# Patient Record
Sex: Male | Born: 1985 | Race: Black or African American | Hispanic: No | Marital: Married | Smoking: Never smoker
Health system: Southern US, Community
[De-identification: ages and names within clinical notes are randomized; demographics above are authoritative.]

## PROBLEM LIST (undated history)

## (undated) DIAGNOSIS — L309 Dermatitis, unspecified: Secondary | ICD-10-CM

## (undated) HISTORY — DX: Dermatitis, unspecified: L30.9

---

## 2013-12-30 ENCOUNTER — Ambulatory Visit: Payer: Self-pay | Admitting: Physician Assistant

## 2014-01-06 ENCOUNTER — Encounter: Payer: Self-pay | Admitting: Physician Assistant

## 2014-01-06 ENCOUNTER — Ambulatory Visit (INDEPENDENT_AMBULATORY_CARE_PROVIDER_SITE_OTHER): Payer: BC Managed Care – PPO

## 2014-01-06 ENCOUNTER — Ambulatory Visit (INDEPENDENT_AMBULATORY_CARE_PROVIDER_SITE_OTHER): Payer: BC Managed Care – PPO | Admitting: Physician Assistant

## 2014-01-06 VITALS — BP 138/100 | HR 74 | Temp 97.9°F | Ht 73.0 in | Wt 339.8 lb

## 2014-01-06 DIAGNOSIS — Z0000 Encounter for general adult medical examination without abnormal findings: Secondary | ICD-10-CM

## 2014-01-06 DIAGNOSIS — L259 Unspecified contact dermatitis, unspecified cause: Secondary | ICD-10-CM

## 2014-01-06 DIAGNOSIS — L309 Dermatitis, unspecified: Secondary | ICD-10-CM

## 2014-01-06 DIAGNOSIS — R030 Elevated blood-pressure reading, without diagnosis of hypertension: Secondary | ICD-10-CM

## 2014-01-06 DIAGNOSIS — IMO0001 Reserved for inherently not codable concepts without codable children: Secondary | ICD-10-CM

## 2014-01-06 LAB — CBC WITH DIFFERENTIAL/PLATELET
BASOS PCT: 0.4 % (ref 0.0–3.0)
Basophils Absolute: 0 10*3/uL (ref 0.0–0.1)
EOS PCT: 4 % (ref 0.0–5.0)
Eosinophils Absolute: 0.3 10*3/uL (ref 0.0–0.7)
HEMATOCRIT: 48.7 % (ref 39.0–52.0)
HEMOGLOBIN: 16 g/dL (ref 13.0–17.0)
LYMPHS ABS: 2.7 10*3/uL (ref 0.7–4.0)
Lymphocytes Relative: 30.9 % (ref 12.0–46.0)
MCHC: 32.9 g/dL (ref 30.0–36.0)
MCV: 92.9 fl (ref 78.0–100.0)
Monocytes Absolute: 0.8 10*3/uL (ref 0.1–1.0)
Monocytes Relative: 9.3 % (ref 3.0–12.0)
NEUTROS ABS: 4.8 10*3/uL (ref 1.4–7.7)
Neutrophils Relative %: 55.4 % (ref 43.0–77.0)
Platelets: 225 10*3/uL (ref 150.0–400.0)
RBC: 5.24 Mil/uL (ref 4.22–5.81)
RDW: 13.5 % (ref 11.5–14.6)
WBC: 8.7 10*3/uL (ref 4.5–10.5)

## 2014-01-06 LAB — URINALYSIS, ROUTINE W REFLEX MICROSCOPIC
Bilirubin Urine: NEGATIVE
Hgb urine dipstick: NEGATIVE
Ketones, ur: NEGATIVE
LEUKOCYTES UA: NEGATIVE
NITRITE: NEGATIVE
PH: 8.5 — AB (ref 5.0–8.0)
RBC / HPF: NONE SEEN (ref 0–?)
SPECIFIC GRAVITY, URINE: 1.015 (ref 1.000–1.030)
UROBILINOGEN UA: 0.2 (ref 0.0–1.0)
Urine Glucose: NEGATIVE
WBC, UA: NONE SEEN (ref 0–?)

## 2014-01-06 LAB — BASIC METABOLIC PANEL
BUN: 9 mg/dL (ref 6–23)
CO2: 26 mEq/L (ref 19–32)
Calcium: 9.5 mg/dL (ref 8.4–10.5)
Chloride: 103 mEq/L (ref 96–112)
Creatinine, Ser: 1.2 mg/dL (ref 0.4–1.5)
GFR: 94.17 mL/min (ref >60.00)
GLUCOSE: 102 mg/dL — AB (ref 70–99)
POTASSIUM: 4.4 meq/L (ref 3.5–5.1)
Sodium: 138 mEq/L (ref 135–145)

## 2014-01-06 LAB — HEPATIC FUNCTION PANEL
ALT: 93 U/L — AB (ref 0–53)
AST: 46 U/L — ABNORMAL HIGH (ref 0–37)
Albumin: 4.5 g/dL (ref 3.5–5.2)
Alkaline Phosphatase: 44 U/L (ref 39–117)
BILIRUBIN TOTAL: 0.9 mg/dL (ref 0.3–1.2)
Bilirubin, Direct: 0.1 mg/dL (ref 0.0–0.3)
TOTAL PROTEIN: 7.8 g/dL (ref 6.0–8.3)

## 2014-01-06 LAB — LIPID PANEL
Cholesterol: 221 mg/dL — ABNORMAL HIGH (ref 0–200)
HDL: 38.2 mg/dL — ABNORMAL LOW (ref >39.00)
Total CHOL/HDL Ratio: 6
Triglycerides: 126 mg/dL (ref 0.0–149.0)
VLDL: 25.2 mg/dL (ref 0.0–40.0)

## 2014-01-06 LAB — TSH: TSH: 3.16 u[IU]/mL (ref 0.35–5.50)

## 2014-01-06 MED ORDER — LISINOPRIL-HYDROCHLOROTHIAZIDE 10-12.5 MG PO TABS: 1.0000 | ORAL_TABLET | Freq: Every day | ORAL | Status: DC

## 2014-01-06 MED ORDER — TRIAMCINOLONE ACETONIDE 0.1 % EX CREA: 1.0000 "application " | TOPICAL_CREAM | Freq: Two times a day (BID) | CUTANEOUS | Status: DC

## 2014-01-06 NOTE — Progress Notes (Signed)
   Patient ID: Ross Strickland is a 28 y.o. male DOB: 1986/05/23 MRN: [id number]     HPI: patient is a 28 year old male who presents to the office today to establish care and have cpe. Patient reports a chronic history with eczema well controlled with Triamcinolone cream. Reports has always been athletic, played football in high school and college. States has always had a high blood pressure and was told in past nothing to be concerned with.  Denies other history of chronic medical conditions or medications. Denies chest pain/palpitations, cough, SOB, extremity swelling, visual change/disturbance, frequent HA, N/V/F/C, pain/difficulty swallowing, numbness, weakness, change in bowel/bladder habits.    Influenza: declines Tetanus: Tdap 2013 Dentist: twice yearly   ROS: As stated in HPI. All other systems negative  Past Medical History  Diagnosis Date  . Eczema    Family History  Problem Relation Age of Onset  . Hyperlipidemia Father   . Eczema Sister   . Eczema Brother   . Dementia Paternal Grandmother     early dementia  . Obesity Paternal Grandfather   . Hypertension Paternal Grandfather   . Eczema Brother   . Eczema Brother   . Eczema Brother    History   Social History  . Marital Status: Married    Spouse Name: N/A    Number of Children: N/A  . Years of Education: N/A   Social History Main Topics  . Smoking status: Never Smoker   . Smokeless tobacco: None  . Alcohol Use: None  . Drug Use: None  . Sexual Activity: None   Other Topics Concern  . None   Social History Narrative  . None   History reviewed. No pertinent past surgical history. No current outpatient prescriptions on file prior to visit.   No current facility-administered medications on file prior to visit.   No Known Allergies  PE: CONSTITUTIONAL: Well developed, well nourished, pleasant, appears stated age, in NAD HEENT: normocephalic, atraumatic, bilateral ext/int canals normal. Bilateral TM's  without injections, bulging, erythema. Nose normal, uvula midline, oropharynx clear and moist. EYES: PERRLA, bilateral EOM and conjunctiva normal NECK: FROM, supple, without thyromegaly or mass CARDIO: RRR, normal S1 and S2, distal pulses intact. No LE edema bilateral. PULM/CHEST CTA bilateral, no wheezes, rales or rhonchi. Non tender. ABD: appearance normal, soft, nontender. Normal bowel sounds x 4 quadrants, no palpable liver, kidney, spleen. GU: deferred.  MUSC: FROM U/LE bilateral LYMPH: no cervical, supraclavicular adenopathy NEURO: alert and oriented x 3, no cranial nerve deficit, motor strength and coordination NL. Negative romberg. Gait normal. SKIN: warm, dry, no rash or lesions noted. PSYCH: Mood and affect normal, speech normal.  Filed Vitals:   01/06/14 1015  BP: 138/100  Pulse: 74  Temp: 97.9 F (36.6 C)   Blood pressure repeated in left arm, my read 130/88. BP in right arm 118/82.   ASSESSMENT and PLAN   CPX/v70.0 - Patient has been counseled on age-appropriate routine health concerns for screening and prevention. These are reviewed and up-to-date. Immunizations are up-to-date or declined. Labs ordered and will be reviewed.   Eczema  Well controlled with Triamcinolone cream, refilled today  Elevated blood pressure: Repeat readings today, no consistent numbers. Concerns for HTN. Started patient on HCTZ 12.5 mg today, patient instructed to take readings in each arm daily and record. RTO in two weeks for reevaluation. Counseled patient may have to consider referral to nephrology for further evaluation.

## 2014-01-06 NOTE — Patient Instructions (Addendum)
It was great meeting you today Ross Strickland!   Labs have been ordered for you, when you report to lab please be fasting.    I have ordered HCTZ for blood pressure control.  Please monitor your blood pressure daily in each arm and document your results. Return to the office for re-evaluation in two weeks please.     Eczema Eczema, also called atopic dermatitis, is a skin disorder that causes inflammation of the skin. It causes a red rash and dry, scaly skin. The skin becomes very itchy. Eczema is generally worse during the cooler winter months and often improves with the warmth of summer. Eczema usually starts showing signs in infancy. Some children outgrow eczema, but it may last through adulthood.  CAUSES  The exact cause of eczema is not known, but it appears to run in families. People with eczema often have a family history of eczema, allergies, asthma, or hay fever. Eczema is not contagious. Flare-ups of the condition may be caused by:   Contact with something you are sensitive or allergic to.   Stress. SIGNS AND SYMPTOMS  Dry, scaly skin.   Red, itchy rash.   Itchiness. This may occur before the skin rash and may be very intense.  DIAGNOSIS  The diagnosis of eczema is usually made based on symptoms and medical history. TREATMENT  Eczema cannot be cured, but symptoms usually can be controlled with treatment and other strategies. A treatment plan might include:  Controlling the itching and scratching.   Use over-the-counter antihistamines as directed for itching. This is especially useful at night when the itching tends to be worse.   Use over-the-counter steroid creams as directed for itching.   Avoid scratching. Scratching makes the rash and itching worse. It may also result in a skin infection (impetigo) due to a break in the skin caused by scratching.   Keeping the skin well moisturized with creams every day. This will seal in moisture and help prevent dryness.  Lotions that contain alcohol and water should be avoided because they can dry the skin.   Limiting exposure to things that you are sensitive or allergic to (allergens).   Recognizing situations that cause stress.   Developing a plan to manage stress.  HOME CARE INSTRUCTIONS   Only take over-the-counter or prescription medicines as directed by your health care provider.   Do not use anything on the skin without checking with your health care provider.   Keep baths or showers short (5 minutes) in warm (not hot) water. Use mild cleansers for bathing. These should be unscented. You may add nonperfumed bath oil to the bath water. It is best to avoid soap and bubble bath.   Immediately after a bath or shower, when the skin is still damp, apply a moisturizing ointment to the entire body. This ointment should be a petroleum ointment. This will seal in moisture and help prevent dryness. The thicker the ointment, the better. These should be unscented.   Keep fingernails cut short. Children with eczema may need to wear soft gloves or mittens at night after applying an ointment.   Dress in clothes made of cotton or cotton blends. Dress lightly, because heat increases itching.   A child with eczema should stay away from anyone with fever blisters or cold sores. The virus that causes fever blisters (herpes simplex) can cause a serious skin infection in children with eczema. SEEK MEDICAL CARE IF:   Your itching interferes with sleep.   Your rash  gets worse or is not better within 1 week after starting treatment.   You see pus or soft yellow scabs in the rash area.   You have a fever.   You have a rash flare-up after contact with someone who has fever blisters.  Document Released: 11/11/2000 Document Revised: 09/04/2013 Document Reviewed: 06/17/2013 [facility] Patient Information 2014 [place], [place].     Testicular Self-Exam A self-exam of your testicles is looking at and  feeling your testicles for abnormal lumps or swelling. Several things can cause swelling, lumps, or pain in your testicles. Some of these causes are:  Injuries.  Puffiness, redness, and soreness (inflammation).  Infection.  Extra fluids around your testicle.  Twisted testicles.  Testicular cancer. The testicles are easiest to check after warm baths or showers. They are harder to examine when you are cold.  Follow these steps while you are standing:  Hold your penis away from your body.  Roll one testicle between your thumb and finger. Feel the entire testicle.  Roll the other testicle between your thumb and finger. Feel the entire testicle. Feel for lumps, swelling, or discomfort. A normal testicle is egg shaped. It feels firm. It is smooth and not tender. The spermatic cord feels like a firm spaghetti-like cord. It is at the back of your testicle. Examine the crease between the front of your leg and your abdomen. Feel for any bumps that are tender. These could be enlarged lymph nodes.  Document Released: 02/10/2009 Document Revised: 09/04/2013 Document Reviewed: 05/06/2013 [facility] Patient Information 2014 [place], [place].     Health Maintenance, Males A healthy lifestyle and preventative care can promote health and wellness.  Maintain regular health, dental, and eye exams.  Eat a healthy diet. Foods like vegetables, fruits, whole grains, low-fat dairy products, and lean protein foods contain the nutrients you need and are low in calories. Decrease your intake of foods high in solid fats, added sugars, and salt. Get information about a proper diet from your health care provider, if necessary.  Regular physical exercise is one of the most important things you can do for your health. Most adults should get at least 150 minutes of moderate-intensity exercise (any activity that increases your heart rate and causes you to sweat) each week. In addition, most adults need  muscle-strengthening exercises on 2 or more days a week.   Maintain a healthy weight. The body mass index (BMI) is a screening tool to identify possible weight problems. It provides an estimate of body fat based on height and weight. Your health care provider can find your BMI and can help you achieve or maintain a healthy weight. For males 20 years and older:  A BMI below 18.5 is considered underweight.  A BMI of 18.5 to 24.9 is normal.  A BMI of 25 to 29.9 is considered overweight.  A BMI of 30 and above is considered obese.  Maintain normal blood lipids and cholesterol by exercising and minimizing your intake of saturated fat. Eat a balanced diet with plenty of fruits and vegetables. Blood tests for lipids and cholesterol should begin at age 28 and be repeated every 5 years. If your lipid or cholesterol levels are high, you are over 50, or you are at high risk for heart disease, you may need your cholesterol levels checked more frequently.Ongoing high lipid and cholesterol levels should be treated with medicines, if diet and exercise are not working.  If you smoke, find out from your health care provider how to quit.  If you do not use tobacco, do not start.  Lung cancer screening is recommended for adults aged 63 80 years who are at high risk for developing lung cancer because of a history of smoking. A yearly low-dose CT scan of the lungs is recommended for people who have at least a 30-pack-year history of smoking and are a current smoker or have quit within the past 15 years. A pack year of smoking is smoking an average of 1 pack of cigarettes a day for 1 year (for example, a 30-pack-year history of smoking could mean smoking 1 pack a day for 30 years or 2 packs a day for 15 years). Yearly screening should continue until the smoker has stopped smoking for at least 15 years. Yearly screening should be stopped for people who develop a health problem that would prevent them from having lung  cancer treatment.  If you choose to drink alcohol, do not have more than 2 drinks per day. One drink is considered to be 12 oz (360 mL) of beer, 5 oz (150 mL) of wine, or 1.5 oz (45 mL) of liquor.  Avoid use of street drugs. Do not share needles with anyone. Ask for help if you need support or instructions about stopping the use of drugs.  High blood pressure causes heart disease and increases the risk of stroke. Blood pressure should be checked at least every 1 2 years. Ongoing high blood pressure should be treated with medicines if weight loss and exercise are not effective.  If you are 47 28 years old, ask your health care provider if you should take aspirin to prevent heart disease.  Diabetes screening involves taking a blood sample to check your fasting blood sugar level. This should be done once every 3 years after age 31, if you are at a normal weight and without risk factors for diabetes. Testing should be considered at a younger age or be carried out more frequently if you are overweight and have at least 1 risk factor for diabetes.  Colorectal cancer can be detected and often prevented. Most routine colorectal cancer screening begins at the age of 43 and continues through age 34. However, your health care provider may recommend screening at an earlier age if you have risk factors for colon cancer. On a yearly basis, your health care provider may provide home test kits to check for hidden blood in the stool. A small camera at the end of a tube may be used to directly examine the colon (sigmoidoscopy or colonoscopy) to detect the earliest forms of colorectal cancer. Talk to your health care provider about this at age 31, when routine screening begins. A direct exam of the colon should be repeated every 5 10 years through age 18, unless early forms of pre-cancerous polyps or small growths are found.  People who are at an increased risk for hepatitis B should be screened for this virus. You are  considered at high risk for hepatitis B if:  You were born in a country where hepatitis B occurs often. Talk with your health care provider about which countries are considered high-risk.  Your parents were born in a high-risk country and you have not received a shot to protect against hepatitis B (hepatitis B vaccine).  You have HIV or AIDS.  You use needles to inject street drugs.  You live with, or have sex with, someone who has hepatitis B.  You are a man who has sex with other men (MSM).  You get hemodialysis treatment.  You take certain medicines for conditions like cancer, organ transplantation, and autoimmune conditions.  Hepatitis C blood testing is recommended for all people born from [birth year] through 1965 and any individual with known risk factors for hepatitis C.  Healthy men should no longer receive prostate-specific antigen (PSA) blood tests as part of routine cancer screening. Talk to your health care provider about prostate cancer screening.  Testicular cancer screening is not recommended for adolescents or adult males who have no symptoms. Screening includes self-exam, a health care provider exam, and other screening tests. Consult with your health care provider about any symptoms you have or any concerns you have about testicular cancer.  Practice safe sex. Use condoms and avoid high-risk sexual practices to reduce the spread of sexually transmitted infections (STIs).  Use sunscreen. Apply sunscreen liberally and repeatedly throughout the day. You should seek shade when your shadow is shorter than you. Protect yourself by wearing long sleeves, pants, a wide-brimmed hat, and sunglasses year round, whenever you are outdoors.  Tell your health care provider of new moles or changes in moles, especially if there is a change in shape or color. Also tell your provider if a mole is larger than the size of a pencil eraser.  A one-time screening for abdominal aortic aneurysm (AAA)  and surgical repair of large AAAs by ultrasound is recommended for men aged 65 75 years who are current or former smokers.  Stay current with your vaccines (immunizations). Document Released: 05/12/2008 Document Revised: 09/04/2013 Document Reviewed: 04/11/2011 [facility] Patient Information 2014 [place], [place].

## 2014-01-06 NOTE — Progress Notes (Signed)
Pre visit review using our clinic review tool, if applicable. No additional management support is needed unless otherwise documented below in the visit note. 

## 2014-01-07 ENCOUNTER — Other Ambulatory Visit: Payer: Self-pay | Admitting: Physician Assistant

## 2014-01-07 DIAGNOSIS — E785 Hyperlipidemia, unspecified: Secondary | ICD-10-CM

## 2014-01-07 LAB — LDL CHOLESTEROL, DIRECT: Direct LDL: 166.4 mg/dL

## 2014-01-07 MED ORDER — ATORVASTATIN CALCIUM 20 MG PO TABS: 20.0000 mg | ORAL_TABLET | Freq: Every day | ORAL | Status: DC

## 2014-01-20 ENCOUNTER — Ambulatory Visit (INDEPENDENT_AMBULATORY_CARE_PROVIDER_SITE_OTHER): Payer: BC Managed Care – PPO | Admitting: Physician Assistant

## 2014-01-20 ENCOUNTER — Encounter: Payer: Self-pay | Admitting: Physician Assistant

## 2014-01-20 VITALS — BP 120/84 | HR 67 | Temp 97.4°F | Ht 73.0 in | Wt 339.8 lb

## 2014-01-20 DIAGNOSIS — E7800 Pure hypercholesterolemia, unspecified: Secondary | ICD-10-CM

## 2014-01-20 DIAGNOSIS — I10 Essential (primary) hypertension: Secondary | ICD-10-CM | POA: Insufficient documentation

## 2014-01-20 NOTE — Progress Notes (Signed)
Pre-visit discussion using our clinic review tool. No additional management support is needed unless otherwise documented below in the visit note.  

## 2014-01-20 NOTE — Assessment & Plan Note (Signed)
Continue on current medications Lipitor 20 mg, one tab daily

## 2014-01-20 NOTE — Progress Notes (Signed)
   Subjective:    Patient ID: Evelena PeatDeshawn Sakai, male    DOB: 08/26/1986, 28 y.o.   MRN: [id number]  HPI Comments: Patient is a 28 year old male who presents to the office for a follow up of starting blood pressure medication. Patient recently started on Prinzide for his blood pressure and lipitor for elevated cholesterol. Reports no side effects form the medication. Log at home for the last two weeks shows bp readings ranging from 120-130/70-80 since starting the medication. Denies N/V, lightheaded, cough, myalgias, dizzy, eye pain or visual change/disturbance, epistaxis, chest pain or palpitations.    Past Medical History  Diagnosis Date  . Eczema     Review of Systems  Constitutional: Negative for fatigue.  HENT: Negative for nosebleeds.   Eyes: Negative for pain and visual disturbance.  Respiratory: Negative for chest tightness.   Cardiovascular: Negative for chest pain and palpitations.  Neurological: Negative for dizziness, weakness, light-headedness, numbness and headaches.       Objective:   Physical Exam  Vitals reviewed. Constitutional: He is oriented to person, place, and time. He appears well-developed and well-nourished.  HENT:  Head: Normocephalic and atraumatic.  Eyes: Conjunctivae are normal.  Neck: Normal range of motion.  Cardiovascular: Normal rate, regular rhythm and intact distal pulses.  Exam reveals no gallop and no friction rub.   No murmur heard. Pulmonary/Chest: Effort normal and breath sounds normal.  Neurological: He is alert and oriented to person, place, and time.  Skin: Skin is warm and dry.  Psychiatric: He has a normal mood and affect.   Filed Vitals:   01/20/14 0808  BP: 120/84  Pulse: 67  Temp: 97.4 F (36.3 C)   BP Readings from Last 3 Encounters:  01/20/14 120/84  01/06/14 138/100       Assessment & Plan:  HTN: Continue on current medications.  Lisinopril-HCTZ 10-12.5 RTO in 6 months for  evaluation  Hypercholesterolemia Continue on current medications Lipitor 20 mg, one tab daily

## 2014-01-20 NOTE — Assessment & Plan Note (Signed)
Continue on current medications.  Lisinopril-HCTZ 10-12.5 RTO in 6 months for evaluation

## 2014-01-20 NOTE — Patient Instructions (Signed)
Nice to see you again Ross Strickland!  Keep up the good work!  Your have three refills remaining on your blood pressure medication.  DASH Diet The DASH diet stands for "Dietary Approaches to Stop Hypertension." It is a healthy eating plan that has been shown to reduce high blood pressure (hypertension) in as little as 14 days, while also possibly providing other significant health benefits. These other health benefits include reducing the risk of breast cancer after menopause and reducing the risk of type 2 diabetes, heart disease, colon cancer, and stroke. Health benefits also include weight loss and slowing kidney failure in patients with chronic kidney disease.  DIET GUIDELINES  Limit salt (sodium). Your diet should contain less than 1500 mg of sodium daily.  Limit refined or processed carbohydrates. Your diet should include mostly whole grains. Desserts and added sugars should be used sparingly.  Include small amounts of heart-healthy fats. These types of fats include nuts, oils, and tub margarine. Limit saturated and trans fats. These fats have been shown to be harmful in the body. CHOOSING FOODS  The following food groups are based on a 2000 calorie diet. See your Registered Dietitian for individual calorie needs. Grains and Grain Products (6 to 8 servings daily)  Eat More Often: Whole-wheat bread, brown rice, whole-grain or wheat pasta, quinoa, popcorn without added fat or salt (air popped).  Eat Less Often: White bread, white pasta, white rice, cornbread. Vegetables (4 to 5 servings daily)  Eat More Often: Fresh, frozen, and canned vegetables. Vegetables may be raw, steamed, roasted, or grilled with a minimal amount of fat.  Eat Less Often/Avoid: Creamed or fried vegetables. Vegetables in a cheese sauce. Fruit (4 to 5 servings daily)  Eat More Often: All fresh, canned (in natural juice), or frozen fruits. Dried fruits without added sugar. One hundred percent fruit juice ( cup [237  mL] daily).  Eat Less Often: Dried fruits with added sugar. Canned fruit in light or heavy syrup. Foot LockerLean Meats, Fish, and Poultry (2 servings or less daily. One serving is 3 to 4 oz [85-114 g]).  Eat More Often: Ninety percent or leaner ground beef, tenderloin, sirloin. Round cuts of beef, chicken breast, Mal[place]urkey breast. All fish. Grill, bake, or broil your meat. Nothing should be fried.  Eat Less Often/Avoid: Fatty cuts of meat, [place]urkey, or chicken leg, thigh, or wing. Fried cuts of meat or fish. Dairy (2 to 3 servings)  Eat More Often: Low-fat or fat-free milk, low-fat plain or light yogurt, reduced-fat or part-skim cheese.  Eat Less Often/Avoid: Milk (whole, 2%).Whole milk yogurt. Full-fat cheeses. Nuts, Seeds, and Legumes (4 to 5 servings per week)  Eat More Often: All without added salt.  Eat Less Often/Avoid: Salted nuts and seeds, canned beans with added salt. Fats and Sweets (limited)  Eat More Often: Vegetable oils, tub margarines without trans fats, sugar-free gelatin. Mayonnaise and salad dressings.  Eat Less Often/Avoid: Coconut oils, palm oils, butter, stick margarine, cream, half and half, cookies, candy, pie. FOR MORE INFORMATION The Dash Diet Eating Plan: www.dashdiet.org Document Released: 11/03/2011 Document Revised: 02/06/2012 Document Reviewed: 11/03/2011 [facility] Patient Information 2014 [place], [place].

## 2014-02-26 ENCOUNTER — Emergency Department (HOSPITAL_COMMUNITY): Payer: BC Managed Care – PPO

## 2014-02-26 ENCOUNTER — Encounter (HOSPITAL_COMMUNITY): Payer: Self-pay | Admitting: Emergency Medicine

## 2014-02-26 ENCOUNTER — Emergency Department (HOSPITAL_COMMUNITY)
Admission: EM | Admit: 2014-02-26 | Discharge: 2014-02-26 | Disposition: A | Discharge status: Home / Self Care | Payer: BC Managed Care – PPO | Attending: Emergency Medicine | Admitting: Emergency Medicine

## 2014-02-26 DIAGNOSIS — X500XXA Overexertion from strenuous movement or load, initial encounter: Secondary | ICD-10-CM | POA: Insufficient documentation

## 2014-02-26 DIAGNOSIS — Z79899 Other long term (current) drug therapy: Secondary | ICD-10-CM | POA: Insufficient documentation

## 2014-02-26 DIAGNOSIS — S82009A Unspecified fracture of unspecified patella, initial encounter for closed fracture: Secondary | ICD-10-CM | POA: Insufficient documentation

## 2014-02-26 DIAGNOSIS — Z872 Personal history of diseases of the skin and subcutaneous tissue: Secondary | ICD-10-CM | POA: Insufficient documentation

## 2014-02-26 DIAGNOSIS — Y9239 Other specified sports and athletic area as the place of occurrence of the external cause: Secondary | ICD-10-CM | POA: Insufficient documentation

## 2014-02-26 DIAGNOSIS — Y92838 Other recreation area as the place of occurrence of the external cause: Secondary | ICD-10-CM

## 2014-02-26 DIAGNOSIS — Y9367 Activity, basketball: Secondary | ICD-10-CM | POA: Insufficient documentation

## 2014-02-26 DIAGNOSIS — IMO0002 Reserved for concepts with insufficient information to code with codable children: Secondary | ICD-10-CM | POA: Insufficient documentation

## 2014-02-26 DIAGNOSIS — S86819A Strain of other muscle(s) and tendon(s) at lower leg level, unspecified leg, initial encounter: Secondary | ICD-10-CM

## 2014-02-26 MED ORDER — HYDROMORPHONE HCL PF 1 MG/ML IJ SOLN: 1.0000 mg | Freq: Once | INTRAMUSCULAR | Status: DC

## 2014-02-26 MED ORDER — OXYCODONE-ACETAMINOPHEN 5-325 MG PO TABS: 1.0000 | ORAL_TABLET | ORAL | Status: DC | PRN

## 2014-02-26 MED ORDER — ONDANSETRON HCL 4 MG/2ML IJ SOLN: 4.0000 mg | Freq: Once | INTRAMUSCULAR | Status: DC

## 2014-02-26 NOTE — ED Notes (Signed)
Pt st's he does not need pain meds at this time.

## 2014-02-26 NOTE — Progress Notes (Signed)
Orthopedic Tech Progress Note Patient Details:  Ross PeatDeshawn Nissan 04/02/1986 [id number]  Ortho Devices Type of Ortho Device: Crutches;Knee Immobilizer Ortho Device/Splint Location: lle Ortho Device/Splint Interventions: Application   Lon Klippel 02/26/2014, 9:44 PM

## 2014-02-26 NOTE — ED Notes (Signed)
Pt advised not to eat or drink anything at this time.  Pt voices understanding.

## 2014-02-26 NOTE — ED Notes (Signed)
Ortho tech paged for knee immobilizer and crutches 

## 2014-02-26 NOTE — ED Provider Notes (Signed)
CSN: [id number]     Arrival date & time 02/26/14  1946 History   First MD Initiated Contact with Patient 02/26/14 1956     Chief Complaint  Patient presents with  . Knee Injury   HPI A 28 year old male presents complaining of knee injury. He is playing basketball. He jumped and came down the leg. He had immediate, severe pain. He felt and heard a pop and his leg gave out. He was unable to stand afterwards even with assistance. His pain is severe, relieved by fentanyl given by EMS. Aggravated by movement. He has no numbness or tingling. He has normal range of motion in his ankle. He's never injured this knee before. He has a history of hyperlipidemia and hypertension. Otherwise no chronic medical problems.   Past Medical History  Diagnosis Date  . Eczema    History reviewed. No pertinent past surgical history. Family History  Problem Relation Age of Onset  . Hyperlipidemia Father   . Eczema Sister   . Eczema Brother   . Dementia Paternal Grandmother     early dementia  . Obesity Paternal Grandfather   . Hypertension Paternal Grandfather   . Eczema Brother   . Eczema Brother   . Eczema Brother    History  Substance Use Topics  . Smoking status: Never Smoker   . Smokeless tobacco: Not on file  . Alcohol Use: Yes     Comment: occ    Review of Systems  Constitutional: Negative for fever and chills.  HENT: Negative for congestion and rhinorrhea.   Eyes: Negative for visual disturbance.  Respiratory: Negative for cough and shortness of breath.   Cardiovascular: Negative for chest pain and leg swelling.  Gastrointestinal: Negative for nausea, vomiting, abdominal pain and diarrhea.  Genitourinary: Negative for dysuria, urgency, frequency, flank pain and difficulty urinating.  Musculoskeletal: Negative for back pain, neck pain and neck stiffness.  Skin: Negative for rash.  Neurological: Negative for syncope, weakness, numbness and headaches.  All other systems reviewed and are  negative.      Allergies  Review of patient's allergies indicates no known allergies.  Home Medications   Current Outpatient Rx  Name  Route  Sig  Dispense  Refill  . atorvastatin (LIPITOR) 20 MG tablet   Oral   Take 1 tablet (20 mg total) by mouth daily.   90 tablet   1   . lisinopril-hydrochlorothiazide (PRINZIDE,ZESTORETIC) 10-12.5 MG per tablet   Oral   Take 1 tablet by mouth daily.   30 tablet   3   . triamcinolone cream (KENALOG) 0.1 %   Topical   Apply 1 application topically 2 (two) times daily.   454 g   1   . oxyCODONE-acetaminophen (PERCOCET) 5-325 MG per tablet   Oral   Take 1-2 tablets by mouth every 4 (four) hours as needed.   30 tablet   0    BP 126/61  Pulse 90  Temp(Src) 97.6 F (36.4 C) (Oral)  Resp 18  Ht 6\' 2"  (1.88 m)  Wt 290 lb (131.543 kg)  BMI 37.22 kg/m2  SpO2 97% Physical Exam  Nursing note and vitals reviewed. Constitutional: He is oriented to person, place, and time. He appears well-developed and well-nourished. No distress.  HENT:  Head: Normocephalic and atraumatic.  Mouth/Throat: Oropharynx is clear and moist.  Eyes: Conjunctivae and EOM are normal. Pupils are equal, round, and reactive to light. No scleral icterus.  Neck: Normal range of motion. Neck supple. No JVD present.  Cardiovascular: Normal rate, regular rhythm, normal heart sounds and intact distal pulses.  Exam reveals no gallop and no friction rub.   No murmur heard. Pulmonary/Chest: Effort normal and breath sounds normal. No respiratory distress. He has no wheezes. He has no rales.  Abdominal: Soft. Bowel sounds are normal. He exhibits no distension. There is no tenderness. There is no rebound and no guarding.  Musculoskeletal: He exhibits no edema.  Left knee with mild tenderness to palpation over medial joint line. He has a high riding left patella. He has a palpable patellar tendon defect. His extensor mechanism of his knee is not intact, he is unable to lift  his heel off the bed. He has no tenderness over his proximal tibia, proximal fibula, or at his ankle. He has normal sensation to touch throughout his lower extremity. He has 2+ DP pulse and his Lipitor should be. No other muscular skeletal injury.  Neurological: He is alert and oriented to person, place, and time. No cranial nerve deficit. He exhibits normal muscle tone. Coordination normal.  Skin: Skin is warm and dry. He is not diaphoretic.    ED Course  Procedures (including critical care time) Labs Review Labs Reviewed - No data to display Imaging Review Dg Knee Ap/lat W/sunrise Left  02/26/2014   CLINICAL DATA:  Anterior knee pain.  EXAM: DG KNEE - 3 VIEWS  COMPARISON:  None.  FINDINGS: Two view exam shows a high riding patella. There is a bony fragment separated from the patella which may well represent the inferior patellar pole. Soft tissue swelling is seen in the anterior knee. No substantial joint effusion.  IMPRESSION: Patella Alta with probable avulsion of the inferior patellar pole versus chronic patellar tendon tear. There is some thickening of the patellar tendon in the region of the bony fragment.   Electronically Signed   By: Kennith CenterEric  Mansell M.D.   On: 02/26/2014 20:41     EKG Interpretation None      MDM   28 yo M with knee injury. Was playing basketball, landed on feet, heard pop, knee pain, leg gave out.  Unable to ambulate. On exam, clinically, he has patellar tendon rupture.  Extensor mechanism not intact.  Palpable tendon defect.  High riding patella. XR confirms patella alta and avulsion of inf patellar pole.  Spoke with orthopedics, Dr. Eulah PontMurphy.  He advises knee immobilizer.  He will see patient in clinic on Monday, plan to operate on Thursday.  - RX for  Discharge Medication List as of 02/26/2014  8:57 PM    START taking these medications   Details  oxyCODONE-acetaminophen (PERCOCET) 5-325 MG per tablet Take 1-2 tablets by mouth every 4 (four) hours as needed.,  Starting 02/26/2014, Until Discontinued, Print       Gave crutches. Advised RICE.  Final diagnoses:  Patellar tendon rupture  Patellar fracture      Toney SangJerrid Vetra Shinall, MD 02/27/14 [id number]

## 2014-02-26 NOTE — ED Notes (Signed)
Playing basketball, came down on left leg, heard a pop, leg gave out.  Left knee swelling, bending is very painful.  Able to bear some weight. Ice applied.

## 2014-02-26 NOTE — Discharge Instructions (Signed)
You have ruptured your patellar tendon, which is the tendon that attaches your kneecap to your lower leg. You will need an operation to repair this. Dr. Eulah PontMurphy I plan to see you in his clinic on Monday morning. He will plan to perform the operation on Thursday.  I will provide you with crutches and a knee immobilizer. He should wear the knee immobilizer at all times except when in bed. You can remove it to take a shower.  We will provide you with a prescription for pain medication. The pain medication is for moderate to severe pain for mild pain he may take Tylenol. However your pain medication contains Tylenol, so you should not take Tylenol more than 3 times daily. Your pain medication should not be taken when you will be operating heavy machinery, including driving. You should not drink alcohol when you are taking your pain medication as mixing the 2 can be dangerous.  Please return to the emergency department for severe pain uncontrolled by oral pain medication.

## 2014-02-27 NOTE — ED Provider Notes (Signed)
I saw and evaluated the patient, reviewed the resident's note and I agree with the findings and plan.   EKG Interpretation None      Ross Strickland is a 28 y.o. male here with L knee pain. He was jumping while playing basketball and heard a pop and then had severe L knee pain. Then he may have landed on L knee. On exam, high riding patella, there is obvious depression at the patella tendon. PCL and ACL otherwise intact. The resident called Dr. Eulah PontMurphy who wants knee immobilizer and f/u in clinic to plan for operation.   Richardean Canalavid H Yao, MD 02/27/14 1052

## 2014-03-05 ENCOUNTER — Other Ambulatory Visit: Payer: Self-pay | Admitting: *Deleted

## 2014-03-05 NOTE — Telephone Encounter (Signed)
[facility] is requesting 90 supply.

## 2014-03-06 MED ORDER — LISINOPRIL-HYDROCHLOROTHIAZIDE 10-12.5 MG PO TABS: 1.0000 | ORAL_TABLET | Freq: Every day | ORAL | Status: DC

## 2014-07-21 ENCOUNTER — Ambulatory Visit: Payer: BC Managed Care – PPO | Admitting: Physician Assistant

## 2014-09-08 ENCOUNTER — Ambulatory Visit: Payer: BC Managed Care – PPO | Admitting: Internal Medicine

## 2014-09-17 ENCOUNTER — Encounter: Payer: Self-pay | Admitting: Family

## 2014-09-17 ENCOUNTER — Ambulatory Visit (INDEPENDENT_AMBULATORY_CARE_PROVIDER_SITE_OTHER): Payer: BC Managed Care – PPO | Admitting: Family

## 2014-09-17 VITALS — BP 138/88 | HR 70 | Temp 97.3°F | Resp 18 | Ht 74.0 in | Wt 340.0 lb

## 2014-09-17 DIAGNOSIS — E7800 Pure hypercholesterolemia, unspecified: Secondary | ICD-10-CM

## 2014-09-17 DIAGNOSIS — I10 Essential (primary) hypertension: Secondary | ICD-10-CM | POA: Diagnosis not present

## 2014-09-17 DIAGNOSIS — L309 Dermatitis, unspecified: Secondary | ICD-10-CM

## 2014-09-17 MED ORDER — TRIAMCINOLONE ACETONIDE 0.1 % EX CREA: 1.0000 "application " | TOPICAL_CREAM | Freq: Two times a day (BID) | CUTANEOUS | Status: DC

## 2014-09-17 NOTE — Assessment & Plan Note (Signed)
Last lipid panel in February was high. Pt stopped taking his medication because he felt it was not working. Counseled patient about the risks of high cholesterol and that he will not feel any different even if the medication is effective. Will obtain lipid panel when patient is fasting and discuss options upon results. Adamant about not taking medications if he does not have to.

## 2014-09-17 NOTE — Patient Instructions (Signed)
Thank you for choosing [facility].  Summary/Instructions:   Your prescription has been sent to your pharmacy  Please schedule a time for your physical in February  Please stop by the lab at your convenience to have your lipid levels drawn. Please be FASTING

## 2014-09-17 NOTE — Assessment & Plan Note (Signed)
Not currently taking any medication and blood pressure today was below goal of <140/<90. Continue current course of no medication at this time. Will follow during physical and patient will periodically record blood pressure measurements.

## 2014-09-17 NOTE — Progress Notes (Signed)
Pre visit review using our clinic review tool, if applicable. No additional management support is needed unless otherwise documented below in the visit note. 

## 2014-09-17 NOTE — Progress Notes (Signed)
   Subjective:    Patient ID: Ross PeatDeshawn Strickland, male    DOB: 04/24/1986, 28 y.o.   MRN: [id number]  Chief Complaint  Patient presents with  . Medication Refill    needs kenalog refill, has to wait to see assigned pcp in late november     HPI:  Ross Strickland is a 28 y.o. male who presents today for medication refill.  1) Eczema - Currently using triamcinolone 0.1% for maintenance. Indicates maintained well. Eczema not all over the body. Denies any adverse effects, states the cream work well.  2) Hypertension - Has not taken any blood pressure medication since April. No chest pain/discomfort, shortness of breath, or edema. States that he gave it a run and did not notice any changes.   BP Readings from Last 3 Encounters:  09/17/14 138/88  02/26/14 126/61  01/20/14 120/84    3) Hypercholesterolemia - currently not taking the Lipitor. Didn't feel like it was doing anything.  Lab Results  Component Value Date   CHOL 221* 01/06/2014   HDL 38.20* 01/06/2014   LDLDIRECT 166.4 01/06/2014   TRIG 126.0 01/06/2014   CHOLHDL 6 01/06/2014   No Known Allergies  Current Outpatient Prescriptions on File Prior to Visit  Medication Sig Dispense Refill  . atorvastatin (LIPITOR) 20 MG tablet Take 1 tablet (20 mg total) by mouth daily.  90 tablet  1  . oxyCODONE-acetaminophen (PERCOCET) 5-325 MG per tablet Take 1-2 tablets by mouth every 4 (four) hours as needed.  30 tablet  0   No current facility-administered medications on file prior to visit.   Past Medical History  Diagnosis Date  . Eczema     Review of Systems    See HPI  Objective:    BP 138/88  Pulse 70  Temp(Src) 97.3 F (36.3 C) (Oral)  Resp 18  Ht 6\' 2"  (1.88 m)  Wt 340 lb (154.223 kg)  BMI 43.63 kg/m2  SpO2 95% Nursing note and vital signs reviewed.  Physical Exam  Constitutional: He is oriented to person, place, and time. He appears well-developed and well-nourished. No distress.  Cardiovascular: Normal rate, regular  rhythm and normal heart sounds.   Pulmonary/Chest: Effort normal and breath sounds normal.  Neurological: He is alert and oriented to person, place, and time.  Skin: Skin is warm and dry.  Psychiatric: He has a normal mood and affect. His behavior is normal. Judgment and thought content normal.       Assessment & Plan:

## 2014-09-17 NOTE — Assessment & Plan Note (Signed)
Currently stable with triamcinoline 0.1%. Discussed importance of moisturizers and use of steroid cream. Continue triamcinolone 0.1% as needed for eczema.

## 2014-12-15 ENCOUNTER — Encounter: Payer: Self-pay | Admitting: Family

## 2014-12-16 ENCOUNTER — Telehealth: Payer: Self-pay | Admitting: *Deleted

## 2014-12-16 NOTE — Telephone Encounter (Signed)
[facility] - Client TELEPHONE ADVICE RECORD [facility] Patient Name: Evelena PeatDESHAWN Parrilla Gender: Male DOB: 10/23/1986 Age: 29 Y 1 M 16 D Return Phone Number: Address: City/State/Zip: [place] [facility] Client Site [facility] Contact Type Call Caller Name same Caller Phone Number n/a Relationship To Patient Self Is this call to report lab results? No Call Type General Information Initial Comment caller states he is running late for his 8am appt General Information Type Appointment Nurse Assessment Guidelines Guideline Title Affirmed Question Affirmed Notes Nurse Date/Time (Eastern Time) Disp. Time Lamount Cohen(Eastern Time) Disposition Final User 12/15/2014 7:57:32 AM General Information Provided Yes Alfonse AlpersMcAtee, Shannon After Care Instructions Given Call Event Type User Date / Time Description

## 2014-12-17 ENCOUNTER — Encounter: Payer: Self-pay | Admitting: Family

## 2014-12-26 ENCOUNTER — Other Ambulatory Visit: Payer: Self-pay

## 2014-12-30 ENCOUNTER — Other Ambulatory Visit (INDEPENDENT_AMBULATORY_CARE_PROVIDER_SITE_OTHER): Payer: Managed Care, Other (non HMO)

## 2014-12-30 ENCOUNTER — Ambulatory Visit (INDEPENDENT_AMBULATORY_CARE_PROVIDER_SITE_OTHER): Payer: Managed Care, Other (non HMO) | Admitting: Family

## 2014-12-30 ENCOUNTER — Encounter: Payer: Self-pay | Admitting: Family

## 2014-12-30 VITALS — BP 124/86 | HR 65 | Temp 98.4°F | Resp 18 | Ht 74.0 in | Wt 338.0 lb

## 2014-12-30 DIAGNOSIS — Z0000 Encounter for general adult medical examination without abnormal findings: Secondary | ICD-10-CM

## 2014-12-30 DIAGNOSIS — L309 Dermatitis, unspecified: Secondary | ICD-10-CM

## 2014-12-30 LAB — BASIC METABOLIC PANEL
BUN: 12 mg/dL (ref 6–23)
CALCIUM: 10.1 mg/dL (ref 8.4–10.5)
CO2: 28 mEq/L (ref 19–32)
Chloride: 103 mEq/L (ref 96–112)
Creatinine, Ser: 1.27 mg/dL (ref 0.40–1.50)
GFR: 86.74 mL/min (ref >60.00)
GLUCOSE: 148 mg/dL — AB (ref 70–99)
Potassium: 4.4 mEq/L (ref 3.5–5.1)
Sodium: 136 mEq/L (ref 135–145)

## 2014-12-30 LAB — CBC
HEMATOCRIT: 48.1 % (ref 39.0–52.0)
HEMOGLOBIN: 16.2 g/dL (ref 13.0–17.0)
MCHC: 33.7 g/dL (ref 30.0–36.0)
MCV: 89.6 fl (ref 78.0–100.0)
Platelets: 223 10*3/uL (ref 150.0–400.0)
RBC: 5.37 Mil/uL (ref 4.22–5.81)
RDW: 13.3 % (ref 11.5–15.5)
WBC: 9.6 10*3/uL (ref 4.0–10.5)

## 2014-12-30 LAB — LIPID PANEL
CHOL/HDL RATIO: 6
CHOLESTEROL: 229 mg/dL — AB (ref 0–200)
HDL: 36.6 mg/dL — ABNORMAL LOW (ref >39.00)
LDL Cholesterol: 157 mg/dL — ABNORMAL HIGH (ref 0–99)
NonHDL: 192.4
Triglycerides: 179 mg/dL — ABNORMAL HIGH (ref 0.0–149.0)
VLDL: 35.8 mg/dL (ref 0.0–40.0)

## 2014-12-30 LAB — TSH: TSH: 2.7 u[IU]/mL (ref 0.35–4.50)

## 2014-12-30 MED ORDER — TRIAMCINOLONE ACETONIDE 0.1 % EX CREA: 1.0000 "application " | TOPICAL_CREAM | Freq: Two times a day (BID) | CUTANEOUS | Status: DC

## 2014-12-30 NOTE — Progress Notes (Signed)
Pre visit review using our clinic review tool, if applicable. No additional management support is needed unless otherwise documented below in the visit note. 

## 2014-12-30 NOTE — Patient Instructions (Signed)
Thank you for choosing [facility].  Summary/Instructions:  Your prescription(s) have been submitted to your pharmacy or been printed and provided for you. Please take as directed and contact our office if you believe you are having problem(s) with the medication(s) or have any questions.  Please stop by the lab on the basement level of the building for your blood work. Your results will be released to MyChart (or called to you) after review, usually within 72 hours after test completion. If any changes need to be made, you will be notified at that same time.   Health Maintenance A healthy lifestyle and preventative care can promote health and wellness.  Maintain regular health, dental, and eye exams.  Eat a healthy diet. Foods like vegetables, fruits, whole grains, low-fat dairy products, and lean protein foods contain the nutrients you need and are low in calories. Decrease your intake of foods high in solid fats, added sugars, and salt. Get information about a proper diet from your health care provider, if necessary.  Regular physical exercise is one of the most important things you can do for your health. Most adults should get at least 150 minutes of moderate-intensity exercise (any activity that increases your heart rate and causes you to sweat) each week. In addition, most adults need muscle-strengthening exercises on 2 or more days a week.   Maintain a healthy weight. The body mass index (BMI) is a screening tool to identify possible weight problems. It provides an estimate of body fat based on height and weight. Your health care provider can find your BMI and can help you achieve or maintain a healthy weight. For males 20 years and older:  A BMI below 18.5 is considered underweight.  A BMI of 18.5 to 24.9 is normal.  A BMI of 25 to 29.9 is considered overweight.  A BMI of 30 and above is considered obese.  Maintain normal blood lipids and cholesterol by exercising and  minimizing your intake of saturated fat. Eat a balanced diet with plenty of fruits and vegetables. Blood tests for lipids and cholesterol should begin at age 20 and be repeated every 5 years. If your lipid or cholesterol levels are high, you are over age 50, or you are at high risk for heart disease, you may need your cholesterol levels checked more frequently.Ongoing high lipid and cholesterol levels should be treated with medicines if diet and exercise are not working.  If you smoke, find out from your health care provider how to quit. If you do not use tobacco, do not start.  Lung cancer screening is recommended for adults aged 55-80 years who are at high risk for developing lung cancer because of a history of smoking. A yearly low-dose CT scan of the lungs is recommended for people who have at least a 30-pack-year history of smoking and are current smokers or have quit within the past 15 years. A pack year of smoking is smoking an average of 1 pack of cigarettes a day for 1 year (for example, a 30-pack-year history of smoking could mean smoking 1 pack a day for 30 years or 2 packs a day for 15 years). Yearly screening should continue until the smoker has stopped smoking for at least 15 years. Yearly screening should be stopped for people who develop a health problem that would prevent them from having lung cancer treatment.  If you choose to drink alcohol, do not have more than 2 drinks per day. One drink is considered to be   12 oz (360 mL) of beer, 5 oz (150 mL) of wine, or 1.5 oz (45 mL) of liquor.  Avoid the use of street drugs. Do not share needles with anyone. Ask for help if you need support or instructions about stopping the use of drugs.  High blood pressure causes heart disease and increases the risk of stroke. Blood pressure should be checked at least every 1-2 years. Ongoing high blood pressure should be treated with medicines if weight loss and exercise are not effective.  If you are  45-79 years old, ask your health care provider if you should take aspirin to prevent heart disease.  Diabetes screening involves taking a blood sample to check your fasting blood sugar level. This should be done once every 3 years after age 45 if you are at a normal weight and without risk factors for diabetes. Testing should be considered at a younger age or be carried out more frequently if you are overweight and have at least 1 risk factor for diabetes.  Colorectal cancer can be detected and often prevented. Most routine colorectal cancer screening begins at the age of 50 and continues through age 75. However, your health care provider may recommend screening at an earlier age if you have risk factors for colon cancer. On a yearly basis, your health care provider may provide home test kits to check for hidden blood in the stool. A small camera at the end of a tube may be used to directly examine the colon (sigmoidoscopy or colonoscopy) to detect the earliest forms of colorectal cancer. Talk to your health care provider about this at age 50 when routine screening begins. A direct exam of the colon should be repeated every 5-10 years through age 75, unless early forms of precancerous polyps or small growths are found.  People who are at an increased risk for hepatitis B should be screened for this virus. You are considered at high risk for hepatitis B if:  You were born in a country where hepatitis B occurs often. Talk with your health care provider about which countries are considered high risk.  Your parents were born in a high-risk country and you have not received a shot to protect against hepatitis B (hepatitis B vaccine).  You have HIV or AIDS.  You use needles to inject street drugs.  You live with, or have sex with, someone who has hepatitis B.  You are a man who has sex with other men (MSM).  You get hemodialysis treatment.  You take certain medicines for conditions like cancer, organ  transplantation, and autoimmune conditions.  Hepatitis C blood testing is recommended for all people born from 1945 through 1965 and any individual with known risk factors for hepatitis C.  Healthy men should no longer receive prostate-specific antigen (PSA) blood tests as part of routine cancer screening. Talk to your health care provider about prostate cancer screening.  Testicular cancer screening is not recommended for adolescents or adult males who have no symptoms. Screening includes self-exam, a health care provider exam, and other screening tests. Consult with your health care provider about any symptoms you have or any concerns you have about testicular cancer.  Practice safe sex. Use condoms and avoid high-risk sexual practices to reduce the spread of sexually transmitted infections (STIs).  You should be screened for STIs, including gonorrhea and chlamydia if:  You are sexually active and are younger than 24 years.  You are older than 24 years, and your health care provider   tells you that you are at risk for this type of infection.  Your sexual activity has changed since you were last screened, and you are at an increased risk for chlamydia or gonorrhea. Ask your health care provider if you are at risk.  If you are at risk of being infected with HIV, it is recommended that you take a prescription medicine daily to prevent HIV infection. This is called pre-exposure prophylaxis (PrEP). You are considered at risk if:  You are a man who has sex with other men (MSM).  You are a heterosexual man who is sexually active with multiple partners.  You take drugs by injection.  You are sexually active with a partner who has HIV.  Talk with your health care provider about whether you are at high risk of being infected with HIV. If you choose to begin PrEP, you should first be tested for HIV. You should then be tested every 3 months for as long as you are taking PrEP.  Use sunscreen. Apply  sunscreen liberally and repeatedly throughout the day. You should seek shade when your shadow is shorter than you. Protect yourself by wearing long sleeves, pants, a wide-brimmed hat, and sunglasses year round whenever you are outdoors.  Tell your health care provider of new moles or changes in moles, especially if there is a change in shape or color. Also, tell your health care provider if a mole is larger than the size of a pencil eraser.  A one-time screening for abdominal aortic aneurysm (AAA) and surgical repair of large AAAs by ultrasound is recommended for men aged 65-75 years who are current or former smokers.  Stay current with your vaccines (immunizations). Document Released: 05/12/2008 Document Revised: 11/19/2013 Document Reviewed: 04/11/2011 Exit[employer] Patient Information 2015 [employer]. This information is not intended to replace advice given to you by your health care provider. Make sure you discuss any questions you have with your health care provider.   

## 2014-12-30 NOTE — Progress Notes (Signed)
Subjective:    Patient ID: Ross Strickland, male    DOB: 09/07/1986, 29 y.o.   MRN: [id number]   Chief Complaint  Patient presents with  . CPE    Fasting    HPI:  Ross PeatDeshawn Lucci is a 29 y.o. male who presents today for an annual wellness visit.   1) Health Maintenance -  Overall feels good.  Diet -  Eating 3 meals per day; limits processed foods; increasing fruits and vegetables.   Exercise - None at this point.   2) Preventative Exams / Immunizations:  Dental -- Due for exam - is scheduled for exam   Vision --  Up to date   Health Maintenance  Topic Date Due  . INFLUENZA VACCINE  06/28/2014  . TETANUS/TDAP  11/28/2021  Declines flu shot.   Immunization History  Administered Date(s) Administered  . Tdap 11/29/2011    No Known Allergies   No current outpatient prescriptions on file prior to visit.   No current facility-administered medications on file prior to visit.    Past Medical History  Diagnosis Date  . Eczema     History reviewed. No pertinent past surgical history.  Family History  Problem Relation Age of Onset  . Hyperlipidemia Father   . Eczema Sister   . Eczema Brother   . Dementia Paternal Grandmother     early dementia  . Obesity Paternal Grandfather   . Hypertension Paternal Grandfather   . Eczema Brother   . Eczema Brother   . Eczema Brother     History   Social History  . Marital Status: Married    Spouse Name: N/A    Number of Children: 1  . Years of Education: 16   Occupational History  . Consultant    Social History Main Topics  . Smoking status: Never Smoker   . Smokeless tobacco: Never Used  . Alcohol Use: Yes     Comment: occasional  . Drug Use: No  . Sexual Activity: Not on file   Other Topics Concern  . Not on file   Social History Narrative   [place], [place]. Currently working on purchasing a home. Lives with his wife and child. Fun: Sports activities   Denies any religious  beliefs that would effect health care.      Review of Systems  Constitutional: Denies fever, chills, fatigue, or significant weight gain/loss. HENT: Head: Denies headache or neck pain Ears: Denies changes in hearing, ringing in ears, earache, drainage Nose: Denies discharge, stuffiness, itching, nosebleed, sinus pain Throat: Denies sore throat, hoarseness, dry mouth, sores, thrush Eyes: Denies loss/changes in vision, pain, redness, blurry/double vision, flashing lights Cardiovascular: Denies chest pain/discomfort, tightness, palpitations, shortness of breath with activity, difficulty lying down, swelling, sudden awakening with shortness of breath Respiratory: Denies shortness of breath, cough, sputum production, wheezing Gastrointestinal: Denies dysphasia, heartburn, change in appetite, nausea, change in bowel habits, rectal bleeding, constipation, diarrhea, yellow skin or eyes Genitourinary: Denies frequency, urgency, burning/pain, blood in urine, incontinence, change in urinary strength. Musculoskeletal: Denies muscle/joint pain, stiffness, back pain, redness or swelling of joints, trauma Skin: Denies rashes, lumps, itching, dryness, color changes, or hair/nail changes Neurological: Denies dizziness, fainting, seizures, weakness, numbness, tingling, tremor Psychiatric - Denies nervousness, stress, depression or memory loss Endocrine: Denies heat or cold intolerance, sweating, frequent urination, excessive thirst, changes in appetite Hematologic: Denies ease of bruising or bleeding     Objective:    BP 124/86 mmHg  Pulse 65  Temp(Src) 98.4 F (36.9 C) (Oral)  Resp 18  Ht  (1.88 m)  Wt 338 lb (153.316 kg)  BMI 43.38 kg/m2  SpO2 94% Nursing note and vital signs reviewed.  Physical Exam  Constitutional: He is oriented to person, place, and time. He appears well-developed and well-nourished.  HENT:  Head: Normocephalic.  Right Ear: Hearing, tympanic membrane, external ear  and ear canal normal.  Left Ear: Hearing, tympanic membrane, external ear and ear canal normal.  Nose: Nose normal.  Mouth/Throat: Uvula is midline, oropharynx is clear and moist and mucous membranes are normal.  Eyes: Conjunctivae and EOM are normal. Pupils are equal, round, and reactive to light.  Neck: Neck supple. No JVD present. No tracheal deviation present. No thyromegaly present.  Cardiovascular: Normal rate, regular rhythm, normal heart sounds and intact distal pulses.   Pulmonary/Chest: Effort normal and breath sounds normal.  Abdominal: Soft. Bowel sounds are normal. He exhibits no distension and no mass. There is no tenderness. There is no rebound and no guarding.  Musculoskeletal: Normal range of motion. He exhibits no edema or tenderness.  Lymphadenopathy:    He has no cervical adenopathy.  Neurological: He is alert and oriented to person, place, and time. He has normal reflexes. No cranial nerve deficit. He exhibits normal muscle tone. Coordination normal.  Skin: Skin is warm and dry.  Psychiatric: He has a normal mood and affect. His behavior is normal. Judgment and thought content normal.       Assessment & Plan:

## 2014-12-30 NOTE — Assessment & Plan Note (Signed)
Refilled Kenalog

## 2014-12-30 NOTE — Assessment & Plan Note (Addendum)
1) Anticipatory Guidance: Discussed importance of wearing a seatbelt while driving and not texting while driving; changing batteries in smoke detector at least once annually; wearing suntan lotion when outside; eating a balanced and moderate diet; getting physical activity at least 30 minutes per day.  2) Immunizations / Screenings / Labs:  Declined flu shot. All immunizations are up to date. Due for a dental and vision exam. All other screenings are up to date per recommendations. Obtain CBC, BMET, Lipid profile and TSH.   Overall well exam. Discussed risk factor of obesity. Recommended goal of losing 5-10% of body weight through increasing nutrient density and physical activity. Otherwise continue healthy lifestyle behaviors and follow up prevention exam in 1 year.

## 2014-12-31 ENCOUNTER — Other Ambulatory Visit: Payer: Self-pay | Admitting: Family

## 2014-12-31 ENCOUNTER — Encounter: Payer: Self-pay | Admitting: Family

## 2014-12-31 DIAGNOSIS — R7301 Impaired fasting glucose: Secondary | ICD-10-CM

## 2015-06-20 IMAGING — CR DG KNEE AP/LAT W/ SUNRISE*L*
2 series · 2 of 2 positions shown · non-contrast
Comparison: None.

CLINICAL DATA: Anterior knee pain.

EXAM:
DG KNEE - 3 VIEWS

[x knee ap left]
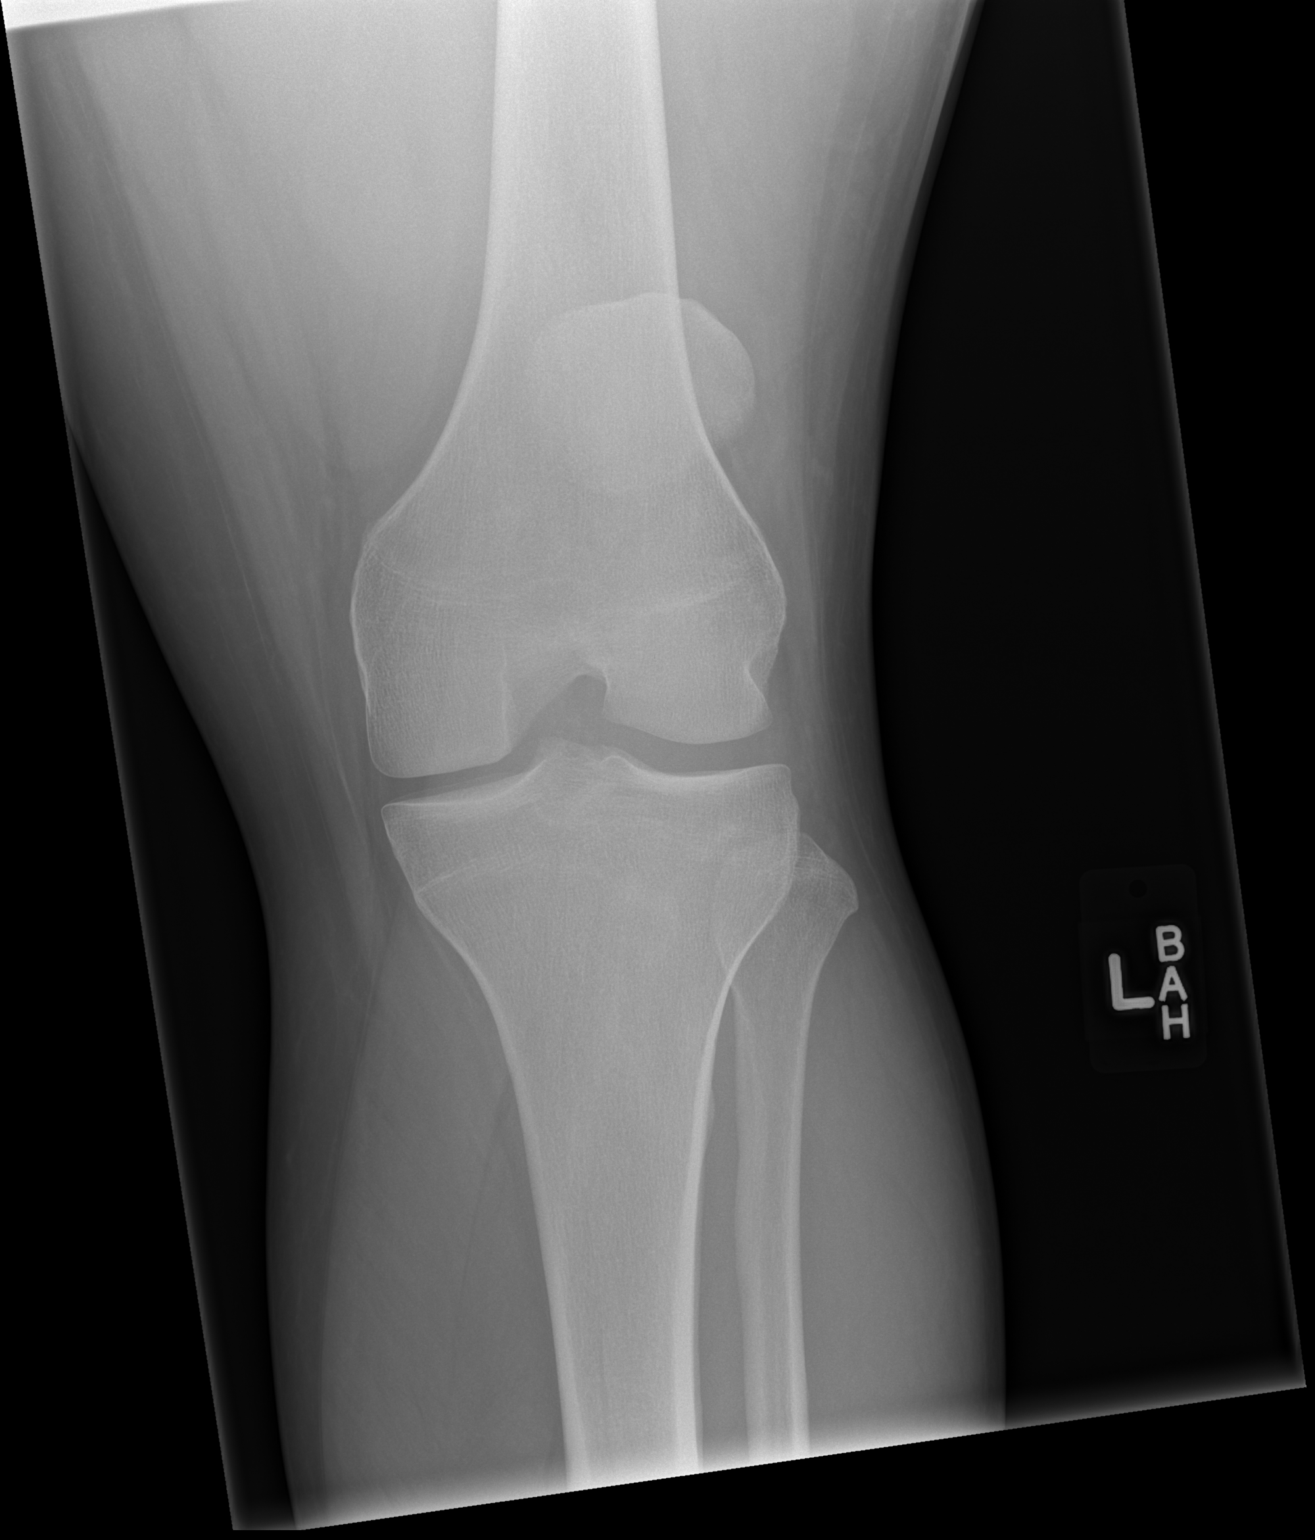

[x knee lat left]
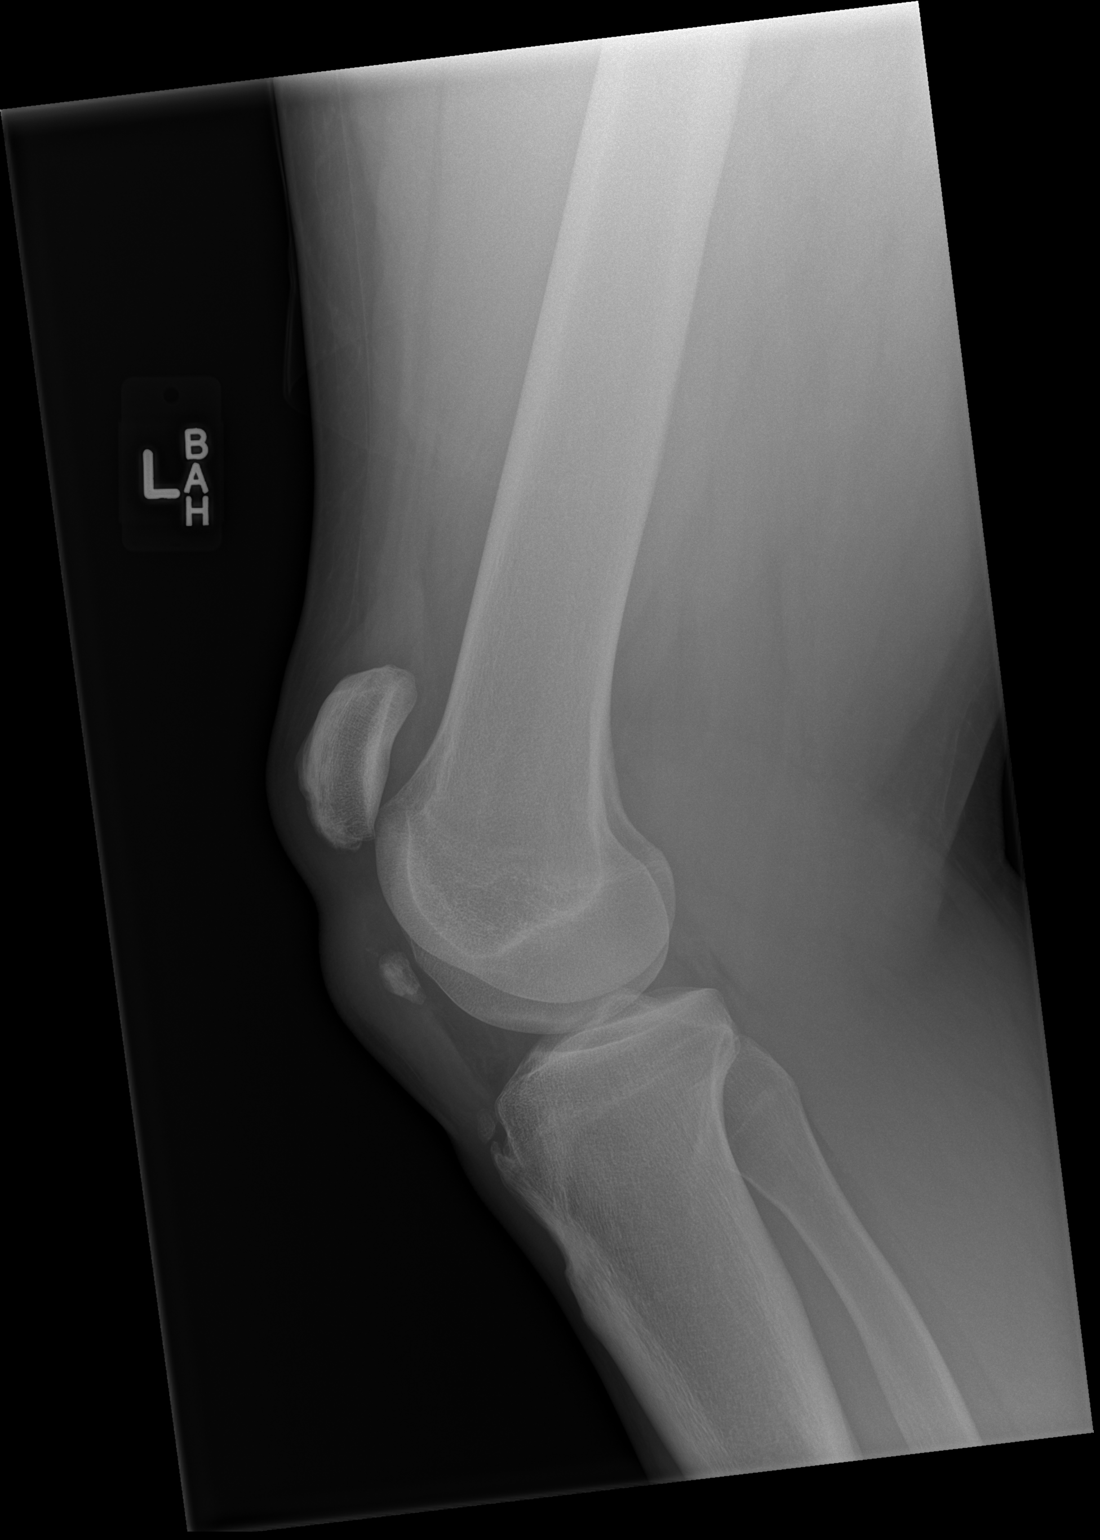

[2 of 2 positions shown; findings below may reference images not displayed]

FINDINGS: Two view exam shows a high riding patella. There is a bony fragment
separated from the patella which may well represent the inferior
patellar pole. Soft tissue swelling is seen in the anterior knee. No
substantial joint effusion.
IMPRESSION: Patella [place] with probable avulsion of the inferior patellar pole
versus chronic patellar tendon tear. There is some thickening of the
patellar tendon in the region of the bony fragment.

## 2015-08-14 ENCOUNTER — Telehealth: Payer: Self-pay | Admitting: *Deleted

## 2015-08-14 MED ORDER — TRIAMCINOLONE ACETONIDE 0.1 % EX CREA: 1.0000 "application " | TOPICAL_CREAM | Freq: Two times a day (BID) | CUTANEOUS | Status: DC

## 2015-08-14 NOTE — Telephone Encounter (Signed)
Receive call pt states he has moved and change pharmacy to [facility] in [place]. Needing to get refill on the triamcinolone cream. Inform pt will update record & send refill to pharmacy...Raechel Chute

## 2015-08-21 ENCOUNTER — Other Ambulatory Visit: Payer: Self-pay

## 2015-08-21 MED ORDER — TRIAMCINOLONE ACETONIDE 0.1 % EX CREA: 1.0000 "application " | TOPICAL_CREAM | Freq: Two times a day (BID) | CUTANEOUS | Status: AC

## 2019-12-06 ENCOUNTER — Ambulatory Visit: Payer: 59 | Attending: Internal Medicine

## 2019-12-06 DIAGNOSIS — Z20822 Contact with and (suspected) exposure to covid-19: Secondary | ICD-10-CM

## 2019-12-08 LAB — NOVEL CORONAVIRUS, NAA: SARS-CoV-2, NAA: NOT DETECTED

## 2020-06-02 ENCOUNTER — Ambulatory Visit: Payer: 59 | Attending: Internal Medicine

## 2020-06-02 DIAGNOSIS — Z23 Encounter for immunization: Secondary | ICD-10-CM

## 2020-06-02 NOTE — Progress Notes (Signed)
   Covid-19 Vaccination Clinic  Name:  Ross Strickland    MRN: [id number] DOB: 12-Jan-1986  06/02/2020  Ross Strickland was observed post Covid-19 immunization for 15 minutes without incident. He was provided with Vaccine Information Sheet and instruction to access the V-Safe system.   Ross Strickland was instructed to call 911 with any severe reactions post vaccine: Marland Kitchen Difficulty breathing  . Swelling of face and throat  . A fast heartbeat  . A bad rash all over body  . Dizziness and weakness   Immunizations Administered    Name Date Dose VIS Date Route   Pfizer COVID-19 Vaccine 06/02/2020 12:22 PM 0.3 mL 01/22/2019 Intramuscular   Manufacturer: [employer], [employer]   Lot: [id number]   NDC: [address]-1

## 2020-06-23 ENCOUNTER — Ambulatory Visit: Payer: Self-pay | Attending: Internal Medicine

## 2020-06-23 DIAGNOSIS — Z23 Encounter for immunization: Secondary | ICD-10-CM

## 2020-06-23 NOTE — Progress Notes (Signed)
   Covid-19 Vaccination Clinic  Name:  Ross Strickland    MRN: [id number] DOB: October 27, 1986  06/23/2020  Ross Strickland was observed post Covid-19 immunization for 15 minutes without incident. He was provided with Vaccine Information Sheet and instruction to access the V-Safe system.   Ross Strickland was instructed to call 911 with any severe reactions post vaccine: Marland Kitchen Difficulty breathing  . Swelling of face and throat  . A fast heartbeat  . A bad rash all over body  . Dizziness and weakness   Immunizations Administered    Name Date Dose VIS Date Route   Pfizer COVID-19 Vaccine 06/23/2020 10:02 AM 0.3 mL 01/22/2019 Intramuscular   Manufacturer: [employer], [employer]   Lot: [id number]   NDC: [address]-2

## 2024-01-29 ENCOUNTER — Ambulatory Visit
Admission: RE | Admit: 2024-01-29 | Discharge: 2024-01-29 | Disposition: A | Discharge status: Home / Self Care | Source: Ambulatory Visit | Attending: Chiropractor | Admitting: Chiropractor

## 2024-01-29 ENCOUNTER — Other Ambulatory Visit: Payer: Self-pay | Admitting: Chiropractor

## 2024-01-29 DIAGNOSIS — M5430 Sciatica, unspecified side: Secondary | ICD-10-CM

## 2024-02-02 ENCOUNTER — Encounter: Payer: Self-pay | Admitting: Chiropractor

## 2024-02-05 ENCOUNTER — Other Ambulatory Visit: Payer: Self-pay | Admitting: Chiropractor

## 2024-02-05 DIAGNOSIS — M545 Low back pain, unspecified: Secondary | ICD-10-CM

## 2024-02-05 DIAGNOSIS — M5430 Sciatica, unspecified side: Secondary | ICD-10-CM

## 2024-02-09 ENCOUNTER — Ambulatory Visit
Admission: RE | Admit: 2024-02-09 | Discharge: 2024-02-09 | Disposition: A | Discharge status: Home / Self Care | Source: Ambulatory Visit | Attending: Chiropractor | Admitting: Chiropractor

## 2024-02-09 DIAGNOSIS — M545 Low back pain, unspecified: Secondary | ICD-10-CM | POA: Diagnosis present

## 2024-02-09 DIAGNOSIS — M549 Dorsalgia, unspecified: Secondary | ICD-10-CM | POA: Insufficient documentation

## 2024-02-09 DIAGNOSIS — M5430 Sciatica, unspecified side: Secondary | ICD-10-CM | POA: Diagnosis present

## 2024-07-16 ENCOUNTER — Other Ambulatory Visit: Payer: Self-pay | Admitting: Medical Genetics

## 2024-07-18 ENCOUNTER — Other Ambulatory Visit

## 2024-07-22 ENCOUNTER — Other Ambulatory Visit
Admission: RE | Admit: 2024-07-22 | Discharge: 2024-07-22 | Disposition: A | Discharge status: Home / Self Care | Payer: Self-pay | Source: Ambulatory Visit | Attending: Medical Genetics | Admitting: Medical Genetics

## 2024-07-30 LAB — GENECONNECT MOLECULAR SCREEN: Genetic Analysis Overall Interpretation: NEGATIVE
